# Patient Record
Sex: Female | Born: 1960 | Race: White | Hispanic: No | Marital: Married | Smoking: Former smoker
Health system: Southern US, Community
[De-identification: ages and names within clinical notes are randomized; demographics above are authoritative.]

## PROBLEM LIST (undated history)

## (undated) DIAGNOSIS — E079 Disorder of thyroid, unspecified: Secondary | ICD-10-CM

## (undated) DIAGNOSIS — I10 Essential (primary) hypertension: Secondary | ICD-10-CM

## (undated) HISTORY — PX: SHOULDER SURGERY: SHX246

---

## 1998-02-19 ENCOUNTER — Emergency Department (HOSPITAL_COMMUNITY): Admission: EM | Admit: 1998-02-19 | Discharge: 1998-02-19 | Payer: Self-pay | Admitting: Emergency Medicine

## 1998-10-10 ENCOUNTER — Encounter: Admission: RE | Admit: 1998-10-10 | Discharge: 1998-10-10 | Payer: Self-pay | Admitting: Internal Medicine

## 1998-11-09 ENCOUNTER — Encounter: Admission: RE | Admit: 1998-11-09 | Discharge: 1998-11-09 | Payer: Self-pay | Admitting: Internal Medicine

## 1999-10-26 ENCOUNTER — Encounter: Admission: RE | Admit: 1999-10-26 | Discharge: 1999-10-26 | Payer: Self-pay | Admitting: Internal Medicine

## 1999-11-18 ENCOUNTER — Emergency Department (HOSPITAL_COMMUNITY): Admission: EM | Admit: 1999-11-18 | Discharge: 1999-11-18 | Payer: Self-pay | Admitting: *Deleted

## 1999-11-18 ENCOUNTER — Encounter: Payer: Self-pay | Admitting: *Deleted

## 1999-12-25 ENCOUNTER — Encounter: Admission: RE | Admit: 1999-12-25 | Discharge: 1999-12-25 | Payer: Self-pay | Admitting: Internal Medicine

## 2000-02-15 ENCOUNTER — Ambulatory Visit (HOSPITAL_COMMUNITY): Admission: RE | Admit: 2000-02-15 | Discharge: 2000-02-15 | Payer: Self-pay | Admitting: Gastroenterology

## 2000-04-23 ENCOUNTER — Emergency Department (HOSPITAL_COMMUNITY): Admission: EM | Admit: 2000-04-23 | Discharge: 2000-04-23 | Payer: Self-pay | Admitting: *Deleted

## 2000-12-27 ENCOUNTER — Encounter: Payer: Self-pay | Admitting: Emergency Medicine

## 2000-12-27 ENCOUNTER — Emergency Department (HOSPITAL_COMMUNITY): Admission: EM | Admit: 2000-12-27 | Discharge: 2000-12-27 | Payer: Self-pay | Admitting: Emergency Medicine

## 2001-09-21 ENCOUNTER — Emergency Department (HOSPITAL_COMMUNITY): Admission: EM | Admit: 2001-09-21 | Discharge: 2001-09-21 | Payer: Self-pay | Admitting: Emergency Medicine

## 2001-10-31 ENCOUNTER — Encounter: Payer: Self-pay | Admitting: Emergency Medicine

## 2001-10-31 ENCOUNTER — Emergency Department (HOSPITAL_COMMUNITY): Admission: EM | Admit: 2001-10-31 | Discharge: 2001-10-31 | Payer: Self-pay | Admitting: Emergency Medicine

## 2002-01-15 ENCOUNTER — Emergency Department (HOSPITAL_COMMUNITY): Admission: EM | Admit: 2002-01-15 | Discharge: 2002-01-15 | Payer: Self-pay | Admitting: *Deleted

## 2002-02-24 ENCOUNTER — Emergency Department (HOSPITAL_COMMUNITY): Admission: EM | Admit: 2002-02-24 | Discharge: 2002-02-24 | Payer: Self-pay | Admitting: Emergency Medicine

## 2002-06-21 ENCOUNTER — Emergency Department (HOSPITAL_COMMUNITY): Admission: EM | Admit: 2002-06-21 | Discharge: 2002-06-21 | Payer: Self-pay

## 2002-07-20 ENCOUNTER — Emergency Department (HOSPITAL_COMMUNITY): Admission: EM | Admit: 2002-07-20 | Discharge: 2002-07-20 | Payer: Self-pay | Admitting: Emergency Medicine

## 2002-07-20 ENCOUNTER — Encounter: Payer: Self-pay | Admitting: Emergency Medicine

## 2002-12-18 ENCOUNTER — Emergency Department (HOSPITAL_COMMUNITY): Admission: EM | Admit: 2002-12-18 | Discharge: 2002-12-18 | Payer: Self-pay | Admitting: Emergency Medicine

## 2002-12-18 ENCOUNTER — Encounter: Payer: Self-pay | Admitting: Emergency Medicine

## 2003-01-03 ENCOUNTER — Encounter: Admission: RE | Admit: 2003-01-03 | Discharge: 2003-01-03 | Payer: Self-pay | Admitting: Internal Medicine

## 2003-02-24 ENCOUNTER — Ambulatory Visit (HOSPITAL_COMMUNITY): Admission: RE | Admit: 2003-02-24 | Discharge: 2003-02-24 | Payer: Self-pay | Admitting: Obstetrics & Gynecology

## 2003-04-19 ENCOUNTER — Emergency Department (HOSPITAL_COMMUNITY): Admission: EM | Admit: 2003-04-19 | Discharge: 2003-04-20 | Payer: Self-pay | Admitting: Emergency Medicine

## 2003-12-03 ENCOUNTER — Encounter: Admission: RE | Admit: 2003-12-03 | Discharge: 2004-02-01 | Payer: Self-pay | Admitting: Orthopedic Surgery

## 2004-01-04 ENCOUNTER — Encounter: Admission: RE | Admit: 2004-01-04 | Discharge: 2004-01-04 | Payer: Self-pay | Admitting: Orthopedic Surgery

## 2006-05-16 ENCOUNTER — Ambulatory Visit: Payer: Self-pay | Admitting: Internal Medicine

## 2006-05-17 ENCOUNTER — Ambulatory Visit: Payer: Self-pay | Admitting: *Deleted

## 2006-05-17 ENCOUNTER — Ambulatory Visit: Payer: Self-pay | Admitting: Internal Medicine

## 2006-05-17 ENCOUNTER — Ambulatory Visit (HOSPITAL_COMMUNITY): Admission: RE | Admit: 2006-05-17 | Discharge: 2006-05-17 | Payer: Self-pay | Admitting: Internal Medicine

## 2006-05-21 ENCOUNTER — Ambulatory Visit: Payer: Self-pay | Admitting: Internal Medicine

## 2006-06-14 ENCOUNTER — Ambulatory Visit: Payer: Self-pay | Admitting: Internal Medicine

## 2006-06-26 ENCOUNTER — Ambulatory Visit: Payer: Self-pay | Admitting: Family Medicine

## 2006-06-26 ENCOUNTER — Encounter: Payer: Self-pay | Admitting: Internal Medicine

## 2006-08-01 ENCOUNTER — Emergency Department (HOSPITAL_COMMUNITY): Admission: EM | Admit: 2006-08-01 | Discharge: 2006-08-01 | Payer: Self-pay | Admitting: Emergency Medicine

## 2007-05-05 ENCOUNTER — Emergency Department (HOSPITAL_COMMUNITY): Admission: EM | Admit: 2007-05-05 | Discharge: 2007-05-05 | Payer: Self-pay | Admitting: Family Medicine

## 2009-02-02 ENCOUNTER — Emergency Department (HOSPITAL_COMMUNITY): Admission: EM | Admit: 2009-02-02 | Discharge: 2009-02-02 | Payer: Self-pay | Admitting: Emergency Medicine

## 2009-07-24 ENCOUNTER — Emergency Department (HOSPITAL_COMMUNITY): Admission: EM | Admit: 2009-07-24 | Discharge: 2009-07-24 | Payer: Self-pay | Admitting: Emergency Medicine

## 2009-08-08 ENCOUNTER — Ambulatory Visit: Payer: Self-pay | Admitting: Vascular Surgery

## 2009-08-08 ENCOUNTER — Emergency Department (HOSPITAL_COMMUNITY): Admission: EM | Admit: 2009-08-08 | Discharge: 2009-08-08 | Payer: Self-pay | Admitting: Emergency Medicine

## 2009-08-09 ENCOUNTER — Ambulatory Visit (HOSPITAL_COMMUNITY): Admission: RE | Admit: 2009-08-09 | Discharge: 2009-08-09 | Payer: Self-pay | Admitting: Emergency Medicine

## 2009-08-09 ENCOUNTER — Encounter (INDEPENDENT_AMBULATORY_CARE_PROVIDER_SITE_OTHER): Payer: Self-pay | Admitting: Emergency Medicine

## 2010-03-25 ENCOUNTER — Encounter: Payer: Self-pay | Admitting: Internal Medicine

## 2010-07-21 NOTE — Procedures (Signed)
[facility]  Patient:    Felicia Faulkner, Felicia Faulkner                        MRN: [id number] Proc. Date: 02/15/00 Attending:  Ulyess Mort, M.D. [facility]                           Procedure Report  PROCEDURES: 1. Upper endoscopy. 2. Flexible sigmoidoscopy.  INDICATIONS:  This is a 50 year old female patient who comes to [facility].  She has had a number of years of heartburn.  She said she had an endoscopic examination four or five years ago and now has worsening pain, and pain regardless of meals.  She gets, what sounds like, dysphagia and reflux.  She has also had some trouble with rectal bleeding with noticing bright red blood with wiping; there is no blood in the bowl.  A question in the past of having H. pylori.  Impression at the time was for significant gastroesophageal reflux disease or dysphagia, probably secondary to stricture and rectal bleeding probably secondary to hemorrhoids.  It was felt thusly she should be on a proton pump inhibitor and was treated with Protonix one q.a.m. and Canasa suppositories as well as Analpram HC cream.  ANESTHESIA:  Premedicated with __ mg Versed, 62.5 mcg fentanyl IV.  The oropharynx was treated with Hurricaine spray.  PROCEDURE:  UPPER ENDOSCOPY  DESCRIPTION OF PROCEDURE:  The Olympus video endoscope was passed through the mouth into the proximal esophagus without difficulty, was normal.  The distal esophagus revealed a 1-2 cm sliding hiatal hernia with no evidence of a stricture.  The endoscope was passed to the stomach where there were changes of a mild to moderate diffuse gastritis.  CLOtest was thusly obtained. The pyloric channel was slightly inflamed.  The duodenal bulb and C-loop revealed granular mucosa consistent with a mild, chronic duodenitis. The endoscope was brought back into the stomach and inverted on itself.  The fundus and cardia were visualized and no evidence of abnormality was   seen. The endoscope was then turned back into normal position.  Biopsies were taken for CLOtest.  The endoscope was then retracted completely.  Photographs were obtained.  The patient tolerated the procedure nicely.  IMPRESSION: 1. Esophagus:  A small hernia with mild reflux esophagitis; no evidence    of a stricture. 2. Stomach:  Mild to moderate gastritis.  Biopsies obtained for    Helicobacter pylori. 3. Pyloric channel:  Slight inflammation as reported above.  Mild,    chronic duodenitis. Findings of C-loop the same.  PLAN:  I would keep her on a proton pump inhibitor and hope that this will help her, however, if her reflux persists then I think she needs further assessment to see if any more definitive therapy is needed to be done.  FLEXIBLE SIGMOIDOSCOPY  INDICATIONS:  Same as above.  ANESTHESIA:  Same as above.  DESCRIPTION OF PROCEDURE:  The Olympus pediatric colonoscope was advanced to the mid transverse colon and on retraction, careful inspection of a fairly well-prepped colon failed to reveal any mucosal lesions of significance.  We did retrovert the endoscope into the rectum and she did have some grade 1 internal hemorrhoids and some hemorrhoidal tags as well as some external hemorrhoids, approximately grade 1-2 at best.  There was no evidence of bleeding at the time.  There was no evidence of thrombosis.  The scope was retracted  completely.  A rectal examination reveals a small rectocele and otherwise was negative with normal sphincter tone.  PLAN:  I would treat her continuously with a bulk diet, Analpram HC cream as needed and I will see her back in the office for followup in the next several weeks. DD:  02/15/00 TD:  02/16/00 Job: [id number]

## 2011-07-30 ENCOUNTER — Emergency Department (HOSPITAL_COMMUNITY): Payer: Self-pay

## 2011-07-30 ENCOUNTER — Encounter (HOSPITAL_COMMUNITY): Payer: Self-pay | Admitting: *Deleted

## 2011-07-30 ENCOUNTER — Emergency Department (HOSPITAL_COMMUNITY)
Admission: EM | Admit: 2011-07-30 | Discharge: 2011-07-30 | Disposition: A | Discharge status: Home Health | Payer: Self-pay | Attending: Emergency Medicine | Admitting: Emergency Medicine

## 2011-07-30 DIAGNOSIS — R109 Unspecified abdominal pain: Secondary | ICD-10-CM | POA: Insufficient documentation

## 2011-07-30 DIAGNOSIS — E079 Disorder of thyroid, unspecified: Secondary | ICD-10-CM | POA: Insufficient documentation

## 2011-07-30 HISTORY — DX: Disorder of thyroid, unspecified: E07.9

## 2011-07-30 LAB — COMPREHENSIVE METABOLIC PANEL
ALT: 13 U/L (ref 0–35)
AST: 17 U/L (ref 0–37)
Albumin: 3.6 g/dL (ref 3.5–5.2)
Chloride: 105 mEq/L (ref 96–112)
Creatinine, Ser: 0.8 mg/dL (ref 0.50–1.10)
Sodium: 141 mEq/L (ref 135–145)
Total Bilirubin: 0.2 mg/dL — ABNORMAL LOW (ref 0.3–1.2)

## 2011-07-30 LAB — DIFFERENTIAL
Basophils Absolute: 0 10*3/uL (ref 0.0–0.1)
Basophils Relative: 0 % (ref 0–1)
Eosinophils Absolute: 0 10*3/uL (ref 0.0–0.7)
Eosinophils Relative: 0 % (ref 0–5)
Lymphocytes Relative: 33 % (ref 12–46)
Lymphs Abs: 3.1 10*3/uL (ref 0.7–4.0)
Monocytes Absolute: 0.5 10*3/uL (ref 0.1–1.0)
Monocytes Relative: 5 % (ref 3–12)
Neutro Abs: 5.6 10*3/uL (ref 1.7–7.7)
Neutrophils Relative %: 61 % (ref 43–77)

## 2011-07-30 LAB — CBC
HCT: 38.3 % (ref 36.0–46.0)
Hemoglobin: 12.8 g/dL (ref 12.0–15.0)
MCH: 30.1 pg (ref 26.0–34.0)
MCHC: 33.4 g/dL (ref 30.0–36.0)
MCV: 90.1 fL (ref 78.0–100.0)
Platelets: 128 10*3/uL — ABNORMAL LOW (ref 150–400)
RBC: 4.25 MIL/uL (ref 3.87–5.11)
RDW: 12.9 % (ref 11.5–15.5)
WBC: 9.2 10*3/uL (ref 4.0–10.5)

## 2011-07-30 LAB — URINALYSIS, ROUTINE W REFLEX MICROSCOPIC
Bilirubin Urine: NEGATIVE
Glucose, UA: NEGATIVE mg/dL
Hgb urine dipstick: NEGATIVE
Ketones, ur: NEGATIVE mg/dL
Nitrite: NEGATIVE
Protein, ur: NEGATIVE mg/dL
Specific Gravity, Urine: 1.028 (ref 1.005–1.030)
Urobilinogen, UA: 0.2 mg/dL (ref 0.0–1.0)
pH: 5 (ref 5.0–8.0)

## 2011-07-30 LAB — GLUCOSE, CAPILLARY: Glucose-Capillary: 155 mg/dL — ABNORMAL HIGH (ref 70–99)

## 2011-07-30 LAB — URINE MICROSCOPIC-ADD ON

## 2011-07-30 MED ORDER — GI COCKTAIL ~~LOC~~
30.0000 mL | Freq: Once | ORAL | Status: AC
  Administered 2011-07-30: 30 mL via ORAL
  Filled 2011-07-30: qty 30

## 2011-07-30 MED ORDER — OMEPRAZOLE 20 MG PO CPDR: 20.0000 mg | DELAYED_RELEASE_CAPSULE | Freq: Every day | ORAL | Status: DC

## 2011-07-30 MED ORDER — IOHEXOL 300 MG/ML  SOLN
100.0000 mL | Freq: Once | INTRAMUSCULAR | Status: AC | PRN
  Administered 2011-07-30: 100 mL via INTRAVENOUS

## 2011-07-30 NOTE — Discharge Instructions (Signed)
Abdominal Pain (Nonspecific) Your exam might not show the exact reason you have abdominal pain. Since there are many different causes of abdominal pain, another checkup and more tests may be needed. It is very important to follow up for lasting (persistent) or worsening symptoms. A possible cause of abdominal pain in any person who still has his or her appendix is acute appendicitis. Appendicitis is often hard to diagnose. Normal blood tests, urine tests, ultrasound, and CT scans do not completely rule out early appendicitis or other causes of abdominal pain. Sometimes, only the changes that happen over time will allow appendicitis and other causes of abdominal pain to be determined. Other potential problems that may require surgery may also take time to become more apparent. Because of this, it is important that you follow all of the instructions below. HOME CARE INSTRUCTIONS   Rest as much as possible.   Do not eat solid food until your pain is gone.   While adults or children have pain: A diet of water, weak decaffeinated tea, broth or bouillon, gelatin, oral rehydration solutions (ORS), frozen ice pops, or ice chips may be helpful.   When pain is gone in adults or children: Start a light diet (dry toast, crackers, applesauce, or white rice). Increase the diet slowly as long as it does not bother you. Eat no dairy products (including cheese and eggs) and no spicy, fatty, fried, or high-fiber foods.   Use no alcohol, caffeine, or cigarettes.   Take your regular medicines unless your caregiver told you not to.   Take any prescribed medicine as directed.   Only take over-the-counter or prescription medicines for pain, discomfort, or fever as directed by your caregiver. Do not give aspirin to children.  If your caregiver has given you a follow-up appointment, it is very important to keep that appointment. Not keeping the appointment could result in a permanent injury and/or lasting (chronic) pain  and/or disability. If there is any problem keeping the appointment, you must call to reschedule.  SEEK IMMEDIATE MEDICAL CARE IF:   Your pain is not gone in 24 hours.   Your pain becomes worse, changes location, or feels different.   You or your child has an oral temperature above 102 F (38.9 C), not controlled by medicine.   Your baby is older than 3 months with a rectal temperature of 102 F (38.9 C) or higher.   Your baby is 3 months old or younger with a rectal temperature of 100.4 F (38 C) or higher.   You have shaking chills.   You keep throwing up (vomiting) or cannot drink liquids.   There is blood in your vomit or you see blood in your bowel movements.   Your bowel movements become dark or black.   You have frequent bowel movements.   Your bowel movements stop (become blocked) or you cannot pass gas.   You have bloody, frequent, or painful urination.   You have yellow discoloration in the skin or whites of the eyes.   Your stomach becomes bloated or bigger.   You have dizziness or fainting.   You have chest or back pain.  MAKE SURE YOU:   Understand these instructions.   Will watch your condition.   Will get help right away if you are not doing well or get worse.  Document Released: 02/19/2005 Document Revised: 02/08/2011 Document Reviewed: 01/17/2009 ExitCare Patient Information 2012 [employer].   RESOURCE GUIDE  Dental Problems  Patients with Medicaid: [facility]                     [address]                                           Phone:  [phone]                                                  If unable to pay or uninsured, contact:  [facility] or [facility] to become qualified for the adult dental clinic.  Chronic Pain Problems Contact [facility]  [phone] Patients need to be referred by their primary care doctor.  Insufficient Money for Medicine Contact [employer]:  call "211" or [facility] 271-5999.  No Primary Care Doctor Call [facility]  832-8000 Other agencies that provide inexpensive medical care    [facility]  [phone]    [facility]  [phone]    [facility]  [phone]    [facility] Clinic  [phone]    [facility]  [phone]    [facility]  [phone]  Substance Abuse Resources [facility]  336-882-2125 [facility] 336-784-9470 [facility] 336-285-9073 [facility] 336-845-3988 Residential & Outpatient Substance Abuse Program  800-659-3381  Psychological Services [facility]  832-9600 [facility]  378-7881 [facility]   800 853-5163 (emergency services 641-4993)  Abuse/Neglect [place] Child Abuse Hotline (336) 641-3795 [place] Child Abuse Hotline 800-378-5315 (After Hours)  Emergency Shelter  [facility] (336) 271-5985  [facility] (336) 275-9566 [facility] (704) 372-4663  MRSA Hotline #:   832-7006    [place] Resources  [facility] [place]  [employer]                           [facility] [address] [place]                     [address]         [address]  [place]                                               [place]                              [place] Phone:  [phone]                                  Phone:  [phone]                   Phone:  [phone]  [facility] Phone:  [phone]  [place] Child Abuse Hotline (336) 342-1394 (336) 342-3537 (After Hours) 

## 2011-07-30 NOTE — ED Provider Notes (Signed)
History     CSN: [id number]  Arrival date & time 07/30/11  1353   First MD Initiated Contact with Patient 07/30/11 1541      Chief Complaint  Patient presents with  . Flank Pain    (Consider location/radiation/quality/duration/timing/severity/associated sxs/prior treatment) HPI Comments: Has not been able to obtain a PCP to workup abd pain.  Worsened over 2 years in LUQ/L flank.  Good PO intake and otherwise doing well.   Patient is a 51 y.o. female presenting with flank pain. The history is provided by the patient.  Flank Pain This is a recurrent problem. Episode onset: 2 years. The problem occurs intermittently. The problem has been gradually worsening. Associated symptoms include abdominal pain (L sided). Pertinent negatives include no chest pain, congestion, fever, nausea, rash or vomiting. Exacerbated by: moving torso. She has tried nothing for the symptoms.    Past Medical History  Diagnosis Date  . Thyroid disease     Past Surgical History  Procedure Date  . Shoulder surgery     History reviewed. No pertinent family history.  History  Substance Use Topics  . Smoking status: Never Smoker   . Smokeless tobacco: Not on file  . Alcohol Use: No    OB History    Grav Para Term Preterm Abortions TAB SAB Ect Mult Living                  Review of Systems  Constitutional: Negative for fever and activity change.  HENT: Negative for congestion.   Eyes: Negative for visual disturbance.  Respiratory: Negative for chest tightness and shortness of breath.   Cardiovascular: Negative for chest pain and leg swelling.  Gastrointestinal: Positive for abdominal pain (L sided). Negative for nausea and vomiting.  Genitourinary: Positive for flank pain. Negative for dysuria.  Skin: Negative for rash.  Neurological: Negative for syncope.  Psychiatric/Behavioral: Negative for behavioral problems.    Allergies  Review of patient's allergies indicates no known  allergies.  Home Medications   Current Outpatient Rx  Name Route Sig Dispense Refill  . ADULT MULTIVITAMIN W/MINERALS CH Oral Take 1 tablet by mouth daily.      BP 123/68  Pulse 77  Temp(Src) 98.3 F (36.8 C) (Oral)  Resp 20  SpO2 97%  Physical Exam  Constitutional: She is oriented to person, place, and time. She appears well-developed and well-nourished.  HENT:  Head: Normocephalic and atraumatic.  Eyes: Conjunctivae and EOM are normal. Pupils are equal, round, and reactive to light. No scleral icterus.  Neck: Normal range of motion. Neck supple.  Cardiovascular: Normal rate and regular rhythm.  Exam reveals no gallop and no friction rub.   No murmur heard. Pulmonary/Chest: Effort normal and breath sounds normal. No respiratory distress. She has no wheezes. She has no rales. She exhibits no tenderness.  Abdominal: Soft. She exhibits no distension and no mass. There is tenderness (moderate LUQ). There is no rebound and no guarding.       Negative murphys.  Musculoskeletal: Normal range of motion.  Neurological: She is alert and oriented to person, place, and time. She has normal reflexes. No cranial nerve deficit.  Skin: Skin is warm and dry. No rash noted.  Psychiatric: She has a normal mood and affect. Her behavior is normal. Judgment and thought content normal.    ED Course  Procedures (including critical care time)  Labs Reviewed  GLUCOSE, CAPILLARY - Abnormal; Notable for the following:    Glucose-Capillary 155 (*)  All other components within normal limits  URINALYSIS, ROUTINE W REFLEX MICROSCOPIC - Abnormal; Notable for the following:    Leukocytes, UA TRACE (*)    All other components within normal limits  URINE MICROSCOPIC-ADD ON - Abnormal; Notable for the following:    Squamous Epithelial / LPF FEW (*)    All other components within normal limits  CBC - Abnormal; Notable for the following:    Platelets 128 (*)    All other components within normal limits   DIFFERENTIAL  COMPREHENSIVE METABOLIC PANEL  LIPASE, BLOOD   No results found.   1. Left sided abdominal pain       MDM  Has not been able to obtain a PCP to workup abd pain.  Worsened over 2 years in LUQ/L flank.  Good PO intake and otherwise doing well.  VSS and well appearing.  TTP in LUQ.  VSS and otherwise well appearing.  Will CT abdomen given persistent pain and tenderness on exam.  Treating with GI cocktail.  Pt declines other meds.    5:56 PM Feeling better after Gi cocktail.  Labs, imaging unconcerning.  Gave info to establish PCP.  Will do trial of PPI.  Pt comfortable with plan and will follow up.     Army Chaco, MD 07/30/11 1757

## 2011-07-30 NOTE — ED Notes (Signed)
Pt undressing and putting on a gown at this time 

## 2011-07-30 NOTE — ED Notes (Signed)
C/o left upper quad pain onset several years ago however states the pain is picking worse. States she gets nauseated when the pain is bad.

## 2011-07-30 NOTE — ED Notes (Signed)
Pt returned from being out of the department (CT scans)

## 2011-07-30 NOTE — ED Notes (Signed)
CBG 155 Rn notified [employer]

## 2011-07-31 NOTE — ED Provider Notes (Signed)
I saw and evaluated the patient, reviewed the resident's note and I agree with the findings and plan.  I saw the patient along with Dr. Maisie Fus and agree with his note, assessment, and plan.  The patient presents with the complaints of llq and left flank pain for the past several months and is getting worse.  She is from [place] and does not have a pcp.  There is no n/v/d or urinary complaints.  No fevers.  On exam, the patient is afebrile and the vitals are stable.  She is mildly ttp in the llq without rebound or guarding.  Bowel sounds are normoactive.  A ct scan was obtained and is essentially unremarkable.  At this point, it does not appear as though there is an emergent cause for her symptoms.  She will be discharged to home, to return prn if worsens.    Geoffery Lyons, MD 07/31/11 1209

## 2011-09-25 ENCOUNTER — Other Ambulatory Visit: Payer: Self-pay | Admitting: Nurse Practitioner

## 2012-05-28 ENCOUNTER — Other Ambulatory Visit: Payer: Self-pay | Admitting: Emergency Medicine

## 2012-05-28 DIAGNOSIS — Z1231 Encounter for screening mammogram for malignant neoplasm of breast: Secondary | ICD-10-CM

## 2012-05-30 ENCOUNTER — Ambulatory Visit
Admission: RE | Admit: 2012-05-30 | Discharge: 2012-05-30 | Disposition: A | Discharge status: Home / Self Care | Payer: BC Managed Care – PPO | Source: Ambulatory Visit | Attending: Emergency Medicine | Admitting: Emergency Medicine

## 2012-05-30 DIAGNOSIS — Z1231 Encounter for screening mammogram for malignant neoplasm of breast: Secondary | ICD-10-CM

## 2012-06-22 ENCOUNTER — Other Ambulatory Visit: Payer: Self-pay | Admitting: Nurse Practitioner

## 2012-06-23 ENCOUNTER — Encounter: Payer: Self-pay | Admitting: Nurse Practitioner

## 2012-12-09 ENCOUNTER — Other Ambulatory Visit: Payer: Self-pay | Admitting: Family Medicine

## 2012-12-09 DIAGNOSIS — E041 Nontoxic single thyroid nodule: Secondary | ICD-10-CM

## 2012-12-16 ENCOUNTER — Ambulatory Visit
Admission: RE | Admit: 2012-12-16 | Discharge: 2012-12-16 | Disposition: A | Discharge status: Home / Self Care | Payer: BC Managed Care – PPO | Source: Ambulatory Visit | Attending: Family Medicine | Admitting: Family Medicine

## 2012-12-16 DIAGNOSIS — E041 Nontoxic single thyroid nodule: Secondary | ICD-10-CM

## 2012-12-22 ENCOUNTER — Other Ambulatory Visit: Payer: Self-pay | Admitting: Family Medicine

## 2012-12-22 DIAGNOSIS — E041 Nontoxic single thyroid nodule: Secondary | ICD-10-CM

## 2012-12-24 ENCOUNTER — Ambulatory Visit
Admission: RE | Admit: 2012-12-24 | Discharge: 2012-12-24 | Disposition: A | Discharge status: Home / Self Care | Payer: BC Managed Care – PPO | Source: Ambulatory Visit | Attending: Family Medicine | Admitting: Family Medicine

## 2012-12-24 ENCOUNTER — Other Ambulatory Visit (HOSPITAL_COMMUNITY)
Admission: RE | Admit: 2012-12-24 | Discharge: 2012-12-24 | Disposition: A | Discharge status: Home / Self Care | Payer: BC Managed Care – PPO | Source: Ambulatory Visit | Attending: Interventional Radiology | Admitting: Interventional Radiology

## 2012-12-24 DIAGNOSIS — E041 Nontoxic single thyroid nodule: Secondary | ICD-10-CM | POA: Insufficient documentation

## 2013-01-27 ENCOUNTER — Ambulatory Visit: Payer: BC Managed Care – PPO | Admitting: Interventional Cardiology

## 2014-06-10 ENCOUNTER — Telehealth: Payer: Self-pay | Admitting: Internal Medicine

## 2014-06-10 NOTE — Telephone Encounter (Signed)
[facility] on 07/15/14 with Dr Rennis GoldenHilty.  [facility] for Dr Blanchie DessertHilty's schedule on 07/15/14. lp

## 2014-07-15 ENCOUNTER — Encounter: Payer: Self-pay | Admitting: Internal Medicine

## 2014-07-15 ENCOUNTER — Ambulatory Visit (INDEPENDENT_AMBULATORY_CARE_PROVIDER_SITE_OTHER): Payer: 59 | Admitting: Internal Medicine

## 2014-07-15 VITALS — BP 120/88 | HR 71 | Ht 67.0 in | Wt 187.6 lb

## 2014-07-15 DIAGNOSIS — Z8249 Family history of ischemic heart disease and other diseases of the circulatory system: Secondary | ICD-10-CM

## 2014-07-15 DIAGNOSIS — Z13220 Encounter for screening for lipoid disorders: Secondary | ICD-10-CM | POA: Insufficient documentation

## 2014-07-15 DIAGNOSIS — R0789 Other chest pain: Secondary | ICD-10-CM | POA: Diagnosis not present

## 2014-07-15 LAB — LIPID PANEL
Cholesterol: 274 mg/dL — ABNORMAL HIGH (ref 0–200)
HDL: 86 mg/dL (ref >=46)
LDL Cholesterol: 168 mg/dL — ABNORMAL HIGH (ref 0–99)
TRIGLYCERIDES: 99 mg/dL (ref <150)
Total CHOL/HDL Ratio: 3.2 Ratio
VLDL: 20 mg/dL (ref 0–40)

## 2014-07-15 NOTE — Progress Notes (Signed)
OFFICE NOTE  Chief Complaint:  Sharp chest pain, occasional palpitations  Primary Care Physician: Lupe Carneyean Mitchell, MD  HPI:  Vance GatherSanije Smylie is a pleasant 54 year old [place] female who is accompanied today by her daughter and who is also translating. Past medical history significant for hypertension. She's noted to have blood pressures up to the 180s when not taking her medicine however on medication is well-controlled. When her blood pressure size she does not note any significant chest pain or associated symptoms. She really has been having some sharp, quick chest discomfort. Is not necessarily associated with exertion or relieved by rest. She denies any worsening shortness of breath. She occasionally gets a fast heartbeat, but it sounds like mostly when she is anxious. There is concern the family due to history of heart disease throughout the family and they're interested in screening for coronary artery disease. I reviewed recent laboratory work and do not see a lipid profile.  PMHx:  Past Medical History  Diagnosis Date  . Thyroid disease     Past Surgical History  Procedure Laterality Date  . Shoulder surgery      FAMHx:  Family History  Problem Relation Age of Onset  . Heart attack Father   . Heart attack Brother     SOCHx:   reports that she has been smoking Cigarettes.  She has been smoking about 0.10 packs per day. She does not have any smokeless tobacco history on file. She reports that she does not drink alcohol or use illicit drugs.  ALLERGIES:  No Known Allergies  ROS: A comprehensive review of systems was negative except for: Cardiovascular: positive for chest pain and palpitations  HOME MEDS: Current Outpatient Prescriptions  Medication Sig Dispense Refill  . fosinopril-hydrochlorothiazide (MONOPRIL-HCT) 10-12.5 MG per tablet Take 1 tablet by mouth daily.  3   No current facility-administered medications for this visit.    LABS/IMAGING: No results  found for this or any previous visit (from the past 48 hour(s)). No results found.  WEIGHTS: Wt Readings from Last 3 Encounters:  07/15/14 187 lb 9.6 oz (85.095 kg)    VITALS: BP 120/88 mmHg  Pulse 71  Ht 5\' 7"  (1.702 m)  Wt 187 lb 9.6 oz (85.095 kg)  BMI 29.38 kg/m2  EXAM: General appearance: alert and no distress Neck: no carotid bruit and no JVD Lungs: clear to auscultation bilaterally Heart: regular rate and rhythm, S1, S2 normal, no murmur, click, rub or gallop Abdomen: soft, non-tender; bowel sounds normal; no masses,  no organomegaly Extremities: extremities normal, atraumatic, no cyanosis or edema Pulses: 2+ and symmetric Skin: Skin color, texture, turgor normal. No rashes or lesions Neurologic: Grossly normal Psych: Mildly anxious  EKG: Normal sinus rhythm at 71  ASSESSMENT: 1. Atypical, sharp chest pain 2. Mild anxiety 3. Strong family history of coronary disease 4. Hypertension-controlled  PLAN: 1.   Mrs. Juanetta BeetsJakupi has a strong family history of coronary disease in her father died at a young age of heart attack in her brother who had also died of heart attack in his 5240s. She's been describing some chest discomfort which is sharp, atypical and short in duration. Occasionally there are palpitations. This seems to be associated with anxiety. I don't believe she is having true angina, however she does desire risk factor identification. Her risk is increased to at least an intermediate level due to her family history. Given her age and thinks she's a good candidate for coronary artery calcium scoring. If she has significant coronary  artery calcium, and may be a role for stress testing. I also recommend a lipid profile. Plan to see her back to discuss those results in a few weeks.  Thanks for the kind referral.  Chrystie NoseKenneth C. Whitley Strycharz, MD, [facility] Attending Cardiologist [facility]  Chrystie NoseKenneth C Demario Faniel 07/15/2014, 9:21 AM

## 2014-07-15 NOTE — Patient Instructions (Signed)
Your physician recommends that you return for lab work FASTING - nothing to eat/drink after midnight - to check cholesterol   Non-Cardiac CT scanning, (CAT scanning), is a noninvasive, special x-ray that produces cross-sectional images of the body using x-rays and a computer. CT scans help physicians diagnose and treat medical conditions. For some CT exams, a contrast material is used to enhance visibility in the area of the body being studied. CT scans provide greater clarity and reveal more details than regular x-ray exams. >> Dr. Rennis GoldenHilty has order a CT coronary calcium score  >> [facility] [address]  Your physician recommends that you schedule a follow-up appointment after your tests with Dr. Rennis GoldenHilty.

## 2014-07-22 ENCOUNTER — Ambulatory Visit (INDEPENDENT_AMBULATORY_CARE_PROVIDER_SITE_OTHER)
Admission: RE | Admit: 2014-07-22 | Discharge: 2014-07-22 | Disposition: A | Discharge status: Home / Self Care | Payer: 59 | Source: Ambulatory Visit | Attending: Internal Medicine | Admitting: Internal Medicine

## 2014-07-22 DIAGNOSIS — Z8249 Family history of ischemic heart disease and other diseases of the circulatory system: Secondary | ICD-10-CM

## 2014-08-11 ENCOUNTER — Encounter: Payer: Self-pay | Admitting: Internal Medicine

## 2014-08-11 ENCOUNTER — Ambulatory Visit (INDEPENDENT_AMBULATORY_CARE_PROVIDER_SITE_OTHER): Payer: 59 | Admitting: Internal Medicine

## 2014-08-11 VITALS — BP 124/72 | HR 68 | Ht 67.0 in | Wt 186.3 lb

## 2014-08-11 DIAGNOSIS — Z8249 Family history of ischemic heart disease and other diseases of the circulatory system: Secondary | ICD-10-CM | POA: Diagnosis not present

## 2014-08-11 DIAGNOSIS — Z13220 Encounter for screening for lipoid disorders: Secondary | ICD-10-CM | POA: Diagnosis not present

## 2014-08-11 DIAGNOSIS — R0789 Other chest pain: Secondary | ICD-10-CM

## 2014-08-11 NOTE — Patient Instructions (Signed)
Your physician recommends that you schedule a follow-up appointment as needed.    Fat and Cholesterol Control Diet Fat and cholesterol levels in your blood and organs are influenced by your diet. High levels of fat and cholesterol may lead to diseases of the heart, small and large blood vessels, gallbladder, liver, and pancreas. CONTROLLING FAT AND CHOLESTEROL WITH DIET Although exercise and lifestyle factors are important, your diet is key. That is because certain foods are known to raise cholesterol and others to lower it. The goal is to balance foods for their effect on cholesterol and more importantly, to replace saturated and trans fat with other types of fat, such as monounsaturated fat, polyunsaturated fat, and omega-3 fatty acids. On average, a person should consume no more than 15 to 17 g of saturated fat daily. Saturated and trans fats are considered "bad" fats, and they will raise LDL cholesterol. Saturated fats are primarily found in animal products such as meats, butter, and cream. However, that does not mean you need to give up all your favorite foods. Today, there are good tasting, low-fat, low-cholesterol substitutes for most of the things you like to eat. Choose low-fat or nonfat alternatives. Choose round or loin cuts of red meat. These types of cuts are lowest in fat and cholesterol. Chicken (without the skin), fish, veal, and ground [place] breast are great choices. Eliminate fatty meats, such as hot dogs and salami. Even shellfish have little or no saturated fat. Have a 3 oz (85 g) portion when you eat lean meat, poultry, or fish. Trans fats are also called "partially hydrogenated oils." They are oils that have been scientifically manipulated so that they are solid at room temperature resulting in a longer shelf life and improved taste and texture of foods in which they are added. Trans fats are found in stick margarine, some tub margarines, cookies, crackers, and baked goods.  When  baking and cooking, oils are a great substitute for butter. The monounsaturated oils are especially beneficial since it is believed they lower LDL and raise HDL. The oils you should avoid entirely are saturated tropical oils, such as coconut and palm.  Remember to eat a lot from food groups that are naturally free of saturated and trans fat, including fish, fruit, vegetables, beans, grains (barley, rice, couscous, bulgur wheat), and pasta (without cream sauces).  IDENTIFYING FOODS THAT LOWER FAT AND CHOLESTEROL  Soluble fiber may lower your cholesterol. This type of fiber is found in fruits such as apples, vegetables such as broccoli, potatoes, and carrots, legumes such as beans, peas, and lentils, and grains such as barley. Foods fortified with plant sterols (phytosterol) may also lower cholesterol. You should eat at least 2 g per day of these foods for a cholesterol lowering effect.  Read package labels to identify low-saturated fats, trans fat free, and low-fat foods at the supermarket. Select cheeses that have only 2 to 3 g saturated fat per ounce. Use a heart-healthy tub margarine that is free of trans fats or partially hydrogenated oil. When buying baked goods (cookies, crackers), avoid partially hydrogenated oils. Breads and muffins should be made from whole grains (whole-wheat or whole oat flour, instead of "flour" or "enriched flour"). Buy non-creamy canned soups with reduced salt and no added fats.  FOOD PREPARATION TECHNIQUES  Never deep-fry. If you must fry, either stir-fry, which uses very little fat, or use non-stick cooking sprays. When possible, broil, bake, or roast meats, and steam vegetables. Instead of putting butter or margarine on vegetables,  use lemon and herbs, applesauce, and cinnamon (for squash and sweet potatoes). Use nonfat yogurt, salsa, and low-fat dressings for salads.  LOW-SATURATED FAT / LOW-FAT FOOD SUBSTITUTES Meats / Saturated Fat (g)  Avoid: Steak, marbled (3 oz/85 g)  / 11 g  Choose: Steak, lean (3 oz/85 g) / 4 g  Avoid: Hamburger (3 oz/85 g) / 7 g  Choose: Hamburger, lean (3 oz/85 g) / 5 g  Avoid: Ham (3 oz/85 g) / 6 g  Choose: Ham, lean cut (3 oz/85 g) / 2.4 g  Avoid: Chicken, with skin, dark meat (3 oz/85 g) / 4 g  Choose: Chicken, skin removed, dark meat (3 oz/85 g) / 2 g  Avoid: Chicken, with skin, light meat (3 oz/85 g) / 2.5 g  Choose: Chicken, skin removed, light meat (3 oz/85 g) / 1 g Dairy / Saturated Fat (g)  Avoid: Whole milk (1 cup) / 5 g  Choose: Low-fat milk, 2% (1 cup) / 3 g  Choose: Low-fat milk, 1% (1 cup) / 1.5 g  Choose: Skim milk (1 cup) / 0.3 g  Avoid: Hard cheese (1 oz/28 g) / 6 g  Choose: Skim milk cheese (1 oz/28 g) / 2 to 3 g  Avoid: Cottage cheese, 4% fat (1 cup) / 6.5 g  Choose: Low-fat cottage cheese, 1% fat (1 cup) / 1.5 g  Avoid: Ice cream (1 cup) / 9 g  Choose: Sherbet (1 cup) / 2.5 g  Choose: Nonfat frozen yogurt (1 cup) / 0.3 g  Choose: Frozen fruit bar / trace  Avoid: Whipped cream (1 tbs) / 3.5 g  Choose: Nondairy whipped topping (1 tbs) / 1 g Condiments / Saturated Fat (g)  Avoid: Mayonnaise (1 tbs) / 2 g  Choose: Low-fat mayonnaise (1 tbs) / 1 g  Avoid: Butter (1 tbs) / 7 g  Choose: Extra light margarine (1 tbs) / 1 g  Avoid: Coconut oil (1 tbs) / 11.8 g  Choose: Olive oil (1 tbs) / 1.8 g  Choose: Corn oil (1 tbs) / 1.7 g  Choose: Safflower oil (1 tbs) / 1.2 g  Choose: Sunflower oil (1 tbs) / 1.4 g  Choose: Soybean oil (1 tbs) / 2.4 g  Choose: Canola oil (1 tbs) / 1 g Document Released: 02/19/2005 Document Revised: 06/16/2012 Document Reviewed: 05/20/2013 ExitCare Patient Information 2015 [employer] [place]. This information is not intended to replace advice given to you by your health care provider. Make sure you discuss any questions you have with your health care provider.

## 2014-08-11 NOTE — Progress Notes (Signed)
OFFICE NOTE  Chief Complaint:  No complaints  Primary Care Physician: Felicia Carney, MD  HPI:  Felicia Faulkner is a pleasant 54 year old [place] female who is accompanied today by her daughter and who is also translating. Past medical history significant for hypertension. She's noted to have blood pressures up to the 180s when not taking her medicine however on medication is well-controlled. When her blood pressure size she does not note any significant chest pain or associated symptoms. She really has been having some sharp, quick chest discomfort. Is not necessarily associated with exertion or relieved by rest. She denies any worsening shortness of breath. She occasionally gets a fast heartbeat, but it sounds like mostly when she is anxious. There is concern the family due to history of heart disease throughout the family and they're interested in screening for coronary artery disease. I reviewed recent laboratory work and do not see a lipid profile.  I saw him Felicia Faulkner back today in follow-up. She reports her chest discomfort has improved. Her CT scan was negative for coronary calcium. No extracardiac findings were noted. This is associated with a low cardiovascular risk. Her lipid profile however does demonstrate an elevated total cholesterol and LDL cholesterol.  PMHx:  Past Medical History  Diagnosis Date  . Thyroid disease     Past Surgical History  Procedure Laterality Date  . Shoulder surgery      FAMHx:  Family History  Problem Relation Age of Onset  . Heart attack Father   . Heart attack Brother     SOCHx:   reports that she has been smoking Cigarettes.  She has been smoking about 0.10 packs per day. She does not have any smokeless tobacco history on file. She reports that she does not drink alcohol or use illicit drugs.  ALLERGIES:  No Known Allergies  ROS: A comprehensive review of systems was negative.  HOME MEDS: Current Outpatient Prescriptions    Medication Sig Dispense Refill  . etodolac (LODINE) 400 MG tablet Take 1 tablet by mouth daily. Take 1 tab twice daily as needed for pain    . fosinopril-hydrochlorothiazide (MONOPRIL-HCT) 10-12.5 MG per tablet Take 1 tablet by mouth daily.  3   No current facility-administered medications for this visit.    LABS/IMAGING: No results found for this or any previous visit (from the past 48 hour(s)). No results found.  WEIGHTS: Wt Readings from Last 3 Encounters:  08/11/14 186 lb 4.8 oz (84.505 kg)  07/15/14 187 lb 9.6 oz (85.095 kg)    VITALS: BP 124/72 mmHg  Pulse 68  Ht  (1.702 m)  Wt 186 lb 4.8 oz (84.505 kg)  BMI 29.17 kg/m2  EXAM: Deferred  EKG: Deferred  ASSESSMENT: 1. Atypical, sharp chest pain 2. Mild anxiety 3. Strong family history of coronary disease 4. Hypertension-controlled  PLAN: 1.   Felicia Faulkner has no evidence of coronary calcium which is reassuring. She is at intermediate risk for coronary disease given age and family history. Her cholesterol is quite elevated. I've given her literature on a heart healthy diet and hopefully she can work on lowering her cholesterol over the next 6 months. She is currently fasting for Ramadan which may help her with her cholesterol in some weight loss. She should have a repeat lipid profile in 6 months through her primary care provider and further follow-up can be with Dr. Clovis Faulkner.  I'm happy to see her back on an as-needed basis. One could consider repeat coronary calcium score  in 10 years.  Felicia NoseKenneth C. Hilty, MD, [facility] Attending Cardiologist [facility]  Felicia AbuKenneth C Faulkner 08/11/2014, 10:02 AM

## 2014-11-11 ENCOUNTER — Other Ambulatory Visit: Payer: Self-pay | Admitting: Family Medicine

## 2014-11-11 ENCOUNTER — Ambulatory Visit
Admission: RE | Admit: 2014-11-11 | Discharge: 2014-11-11 | Disposition: A | Discharge status: Home / Self Care | Payer: 59 | Source: Ambulatory Visit | Attending: Family Medicine | Admitting: Family Medicine

## 2014-11-11 ENCOUNTER — Other Ambulatory Visit (HOSPITAL_COMMUNITY)
Admission: RE | Admit: 2014-11-11 | Discharge: 2014-11-11 | Disposition: A | Discharge status: Home / Self Care | Payer: 59 | Source: Ambulatory Visit | Attending: Family Medicine | Admitting: Family Medicine

## 2014-11-11 DIAGNOSIS — Z124 Encounter for screening for malignant neoplasm of cervix: Secondary | ICD-10-CM | POA: Diagnosis present

## 2014-11-11 DIAGNOSIS — M79641 Pain in right hand: Secondary | ICD-10-CM

## 2014-11-15 ENCOUNTER — Other Ambulatory Visit: Payer: Self-pay | Admitting: *Deleted

## 2014-11-15 DIAGNOSIS — R200 Anesthesia of skin: Secondary | ICD-10-CM

## 2014-11-16 ENCOUNTER — Ambulatory Visit (INDEPENDENT_AMBULATORY_CARE_PROVIDER_SITE_OTHER): Payer: 59 | Admitting: Neurology

## 2014-11-16 DIAGNOSIS — R200 Anesthesia of skin: Secondary | ICD-10-CM | POA: Diagnosis not present

## 2014-11-16 DIAGNOSIS — G5601 Carpal tunnel syndrome, right upper limb: Secondary | ICD-10-CM

## 2014-11-16 LAB — CYTOLOGY - PAP

## 2014-11-16 NOTE — Procedures (Signed)
[facility]  [address] [place], [address]  [place], [place] [address] Tel: [phone] Fax:  [phone] Test Date:  11/16/2014  Patient: Felicia Faulkner DOB: [birth year] Physician: Nita Sickle  Sex: Female Height: 5\' 6"  Ref Phys: Lupe Carney, M.D.  ID#: [id number] Temp: 38.1C Technician: Judie Petit. Dean   Patient Complaints: This is a 54 year old female presenting for evaluation of bilateral hand paresthesias and pain.  NCV & EMG Findings: Extensive electrodiagnostic testing of the right upper extremity and additional studies of the left shows:  1. Bilateral median, ulnar, radial, and left palmar sensory responses are within normal limits. Right palmar sensory response is borderline abnormal. 2. Bilateral median and ulnar motor responses are within normal limits. 3. There is no evidence of active or chronic motor axon loss changes affecting any of the tested muscles. Motor unit configuration and recruitment pattern is within normal limits.  Impression: 1. Right median neuropathy at or distal to the wrist, consistent with the clinical diagnosis of carpal tunnel syndrome. Overall, these findings are very mild in degree electrically. 2. There is no evidence of a cervical radiculopathy, left carpal tunnel syndrome, or diffuse myopathy.   ___________________________ Nita Sickle, DO    Nerve Conduction Studies Anti Sensory Summary Table   Stim Site NR Peak (ms) Norm Peak (ms) P-T Amp (V) Norm P-T Amp  Left Median Anti Sensory (2nd Digit)  38.1C  Wrist    3.2 <3.6 18.9 >15  Right Median Anti Sensory (2nd Digit)  38.1C  Wrist    3.4 <3.6 18.4 >15  Left Radial Anti Sensory (Base 1st Digit)  38.1C  Wrist    1.9 <2.7 26.7 >14  Right Radial Anti Sensory (Base 1st Digit)  38.1C  Wrist    1.9 <2.7 30.8 >14  Left Ulnar Anti Sensory (5th Digit)  38.1C  Wrist    2.7 <3.1 19.9 >10  Right Ulnar Anti Sensory (5th Digit)  38.1C  Wrist    3.0 <3.1 21.0 >10   Motor Summary  Table   Stim Site NR Onset (ms) Norm Onset (ms) O-P Amp (mV) Norm O-P Amp Site1 Site2 Delta-0 (ms) Dist (cm) Vel (m/s) Norm Vel (m/s)  Left Median Motor (Abd Poll Brev)  38.1C  Wrist    3.3 <4.0 9.3 >6 Elbow Wrist 3.8 23.0 61 >50  Elbow    7.1  8.8         Right Median Motor (Abd Poll Brev)  38.1C  Wrist    3.8 <4.0 9.4 >6 Elbow Wrist 4.2 24.0 57 >50  Elbow    8.0  9.4         Left Ulnar Motor (Abd Dig Minimi)  38.1C  Wrist    2.2 <3.1 10.7 >7 B Elbow Wrist 3.4 21.0 62 >50  B Elbow    5.6  10.4  A Elbow B Elbow 1.6 10.0 62 >50  A Elbow    7.2  10.0         Right Ulnar Motor (Abd Dig Minimi)  38.1C  Wrist    2.5 <3.1 9.4 >7 B Elbow Wrist 3.4 20.0 59 >50  B Elbow    5.9  8.7  A Elbow B Elbow 1.6 10.0 63 >50  A Elbow    7.5  7.7          Comparison Summary Table   Stim Site NR Peak (ms) Norm Peak (ms) P-T Amp (V) Site1 Site2 Delta-P (ms) Norm Delta (ms)  Left Median/Ulnar Palm Comparison (Wrist -  8cm)  38.1C  Median Palm    2.0 <2.2 162.0 Median Palm Ulnar Palm 0.3   Ulnar Palm    1.7 <2.2 19.1      Right Median/Ulnar Palm Comparison (Wrist - 8cm)  38.1C  Median Palm    2.2 <2.2 56.6 Median Palm Ulnar Palm 0.4   Ulnar Palm    1.8 <2.2 24.7       EMG   Side Muscle Ins Act Fibs Psw Fasc Number Recrt Dur Dur. Amp Amp. Poly Poly. Comment  Right 1stDorInt Nml Nml Nml Nml Nml Nml Nml Nml Nml Nml Nml Nml N/A  Right Abd Poll Brev Nml Nml Nml Nml Nml Nml Nml Nml Nml Nml Nml Nml N/A  Right Ext Indicis Nml Nml Nml Nml Nml Nml Nml Nml Nml Nml Nml Nml N/A  Right PronatorTeres Nml Nml Nml Nml Nml Nml Nml Nml Nml Nml Nml Nml N/A  Right Biceps Nml Nml Nml Nml Nml Nml Nml Nml Nml Nml Nml Nml N/A  Right Triceps Nml Nml Nml Nml Nml Nml Nml Nml Nml Nml Nml Nml N/A  Right Deltoid Nml Nml Nml Nml Nml Nml Nml Nml Nml Nml Nml Nml N/A  Left 1stDorInt Nml Nml Nml Nml Nml Nml Nml Nml Nml Nml Nml Nml N/A  Left Ext Indicis Nml Nml Nml Nml Nml Nml Nml Nml Nml Nml Nml Nml N/A  Left PronatorTeres Nml Nml  Nml Nml Nml Nml Nml Nml Nml Nml Nml Nml N/A  Left Biceps Nml Nml Nml Nml Nml Nml Nml Nml Nml Nml Nml Nml N/A  Left Triceps Nml Nml Nml Nml Nml Nml Nml Nml Nml Nml Nml Nml N/A  Left Deltoid Nml Nml Nml Nml Nml Nml Nml Nml Nml Nml Nml Nml N/A      Waveforms:

## 2017-02-06 ENCOUNTER — Ambulatory Visit: Payer: Self-pay | Admitting: Podiatry

## 2017-02-06 ENCOUNTER — Other Ambulatory Visit: Payer: Self-pay | Admitting: Podiatry

## 2017-02-06 ENCOUNTER — Ambulatory Visit: Payer: Self-pay

## 2017-02-06 ENCOUNTER — Ambulatory Visit (INDEPENDENT_AMBULATORY_CARE_PROVIDER_SITE_OTHER): Payer: Self-pay

## 2017-02-06 VITALS — BP 132/76 | HR 71

## 2017-02-06 DIAGNOSIS — M7662 Achilles tendinitis, left leg: Secondary | ICD-10-CM

## 2017-02-06 DIAGNOSIS — G5761 Lesion of plantar nerve, right lower limb: Secondary | ICD-10-CM

## 2017-02-06 DIAGNOSIS — M79671 Pain in right foot: Secondary | ICD-10-CM

## 2017-02-06 DIAGNOSIS — M79672 Pain in left foot: Secondary | ICD-10-CM

## 2017-02-06 MED ORDER — DICLOFENAC SODIUM 75 MG PO TBEC: 75.0000 mg | DELAYED_RELEASE_TABLET | Freq: Two times a day (BID) | ORAL | 1 refills | Status: DC

## 2017-02-06 MED ORDER — METHYLPREDNISOLONE 4 MG PO TBPK: ORAL_TABLET | ORAL | 0 refills | Status: DC

## 2017-02-10 NOTE — Progress Notes (Signed)
   HPI: 56 year old female presenting today as a new patient with complaints pain to bilateral feet that has been present for the last year.  She reports the pain is located to the posterior left heel and the dorsum of the right foot.  She reports associated swelling to the lateral side of the feet.  Walking increases this pain.  She denies alleviating factors.  She has not done anything to treat the symptoms.  Patient is here for further evaluation and treatment.    Past Medical History:  Diagnosis Date  . Thyroid disease       Physical Exam: General: The patient is alert and oriented x3 in no acute distress.  Dermatology: Skin is warm, dry and supple bilateral lower extremities. Negative for open lesions or macerations.  Vascular: Palpable pedal pulses bilaterally. No edema or erythema noted. Capillary refill within normal limits.  Neurological: Epicritic and protective threshold grossly intact bilaterally.   Musculoskeletal Exam: Pain on palpation noted to the posterior tubercle of the left calcaneus at the insertion of the Achilles tendon consistent with retrocalcaneal bursitis. Range of motion within normal limits. Muscle strength 5/5 in all muscle groups bilateral lower extremities. Sharp pain with palpation of the fourth interspace and lateral compression of the metatarsal heads of the right foot consistent with neuroma.  Positive Lendell CapriceSullivan sign with loadbearing of the forefoot.  Radiographic Exam:  Posterior calcaneal spur noted to the respective calcaneus on lateral view. No fracture or dislocation noted. Normal osseous mineralization noted.     Assessment: 1. Insertional Achilles tendinitis left 2. Retrocalcaneal bursitis 3.  Morton's neuroma right fourth interspace   Plan of Care:  1. Patient was evaluated. Radiographs were reviewed today. 2. Injection of 0.5 mL Celestone Soluspan injected into the retrocalcaneal bursa. Care was taken to avoid direct injection into the  Achilles tendon. 3.  Injection of 0.5 mL Celestone Soluspan injected into the neuroma of the right foot. 4.  Cam boot dispensed. 5.  Prescription for Medrol Dosepak provided to patient. 6.  Prescription for diclofenac provided to patient. 7.  Return to clinic in 4 weeks.   Felicia Faulkner, DPM [facility]  Dr. Felecia ShellingBrent M. Macklyn Faulkner, DPM    [address]                                        [place], [place] [address]                Office [phone](336) [phone]  Fax [phone]([phone]

## 2017-03-04 ENCOUNTER — Ambulatory Visit: Payer: Self-pay | Admitting: Podiatry

## 2017-03-13 ENCOUNTER — Ambulatory Visit: Payer: BLUE CROSS/BLUE SHIELD | Admitting: Podiatry

## 2017-03-13 DIAGNOSIS — G5761 Lesion of plantar nerve, right lower limb: Secondary | ICD-10-CM

## 2017-03-13 DIAGNOSIS — M7662 Achilles tendinitis, left leg: Secondary | ICD-10-CM | POA: Diagnosis not present

## 2017-03-14 ENCOUNTER — Telehealth: Payer: Self-pay | Admitting: *Deleted

## 2017-03-14 DIAGNOSIS — M779 Enthesopathy, unspecified: Secondary | ICD-10-CM

## 2017-03-14 DIAGNOSIS — T148XXA Other injury of unspecified body region, initial encounter: Secondary | ICD-10-CM

## 2017-03-14 NOTE — Telephone Encounter (Signed)
Orders to J. Quintana, RN for pre-cert, faxed to [facility]. 

## 2017-03-14 NOTE — Telephone Encounter (Signed)
-----   Message from Felecia ShellingBrent M Evans, DPM sent at 03/13/2017  2:50 PM EST ----- Regarding: MRI left ankle Please order MRI left ankle with or without contrast  Diagnosis: Achilles tendinitis /with possible tear left  Thanks, Dr. Logan BoresEvans

## 2017-03-17 NOTE — Progress Notes (Signed)
   HPI: 57 year old female presenting today for follow-up evaluation of pain to the bilateral feet.  Wearing the cam boot helps alleviate the pain.  Walking long distances increases the pain.  Patient is here for further evaluation and treatment.    Past Medical History:  Diagnosis Date  . Thyroid disease       Physical Exam: General: The patient is alert and oriented x3 in no acute distress.  Dermatology: Skin is warm, dry and supple bilateral lower extremities. Negative for open lesions or macerations.  Vascular: Palpable pedal pulses bilaterally. No edema or erythema noted. Capillary refill within normal limits.  Neurological: Epicritic and protective threshold grossly intact bilaterally.   Musculoskeletal Exam: Pain on palpation noted to the posterior tubercle of the left calcaneus at the insertion of the Achilles tendon consistent with retrocalcaneal bursitis. Range of motion within normal limits. Muscle strength 5/5 in all muscle groups bilateral lower extremities. Sharp pain with palpation of the fourth interspace and lateral compression of the metatarsal heads of the right foot consistent with neuroma.  Positive Lendell CapriceSullivan sign with loadbearing of the forefoot.  Assessment: 1. Insertional Achilles tendinitis left 2. Retrocalcaneal bursitis 3. Morton's neuroma right fourth interspace   Plan of Care:  1. Patient was evaluated.  2. Injection of 0.5 mL Celestone Soluspan injected into the neuroma of the right foot. 3.  Orders for MRI of the left ankle placed today.  Patient has had symptoms for greater than 8 weeks and failed all conservative treatment. 4.  Continue weightbearing in cam boot. 5.   Return to clinic in 4 weeks to review MRI.   Felecia ShellingBrent M. Evans, DPM [facility]  Dr. Felecia ShellingBrent M. Evans, DPM    [address]                                        [place], [place] [address]                Office [phone]  Fax [phone]) [phone]

## 2017-03-25 ENCOUNTER — Ambulatory Visit
Admission: RE | Admit: 2017-03-25 | Discharge: 2017-03-25 | Disposition: A | Discharge status: Home / Self Care | Payer: BLUE CROSS/BLUE SHIELD | Source: Ambulatory Visit | Attending: Podiatry | Admitting: Podiatry

## 2017-04-09 ENCOUNTER — Other Ambulatory Visit: Payer: Self-pay | Admitting: Podiatry

## 2017-04-12 ENCOUNTER — Telehealth: Payer: Self-pay | Admitting: Podiatry

## 2017-04-12 NOTE — Telephone Encounter (Signed)
I'm calling for Felicia Faulkner who had an MRI a couple of weeks ago. We have not heard anything from anyone with any results or what the doctor said. Can someone please call us back at [phone]. Thank you.

## 2017-04-12 NOTE — Telephone Encounter (Signed)
I informed pt's Avni, the MRI results would be discussed at an appt, and transferred to schedulers.

## 2017-04-29 ENCOUNTER — Ambulatory Visit: Payer: BLUE CROSS/BLUE SHIELD | Admitting: Podiatry

## 2017-04-29 ENCOUNTER — Encounter: Payer: Self-pay | Admitting: Podiatry

## 2017-04-29 DIAGNOSIS — G609 Hereditary and idiopathic neuropathy, unspecified: Secondary | ICD-10-CM | POA: Diagnosis not present

## 2017-04-29 MED ORDER — GABAPENTIN 100 MG PO CAPS: 100.0000 mg | ORAL_CAPSULE | Freq: Three times a day (TID) | ORAL | 1 refills | Status: DC

## 2017-04-30 NOTE — Progress Notes (Signed)
   HPI: 57 year old female presenting today for follow up evaluation bilateral foot pain. She states the pain is unchanged at this time. She states the injection to the neuroma of the right foot provided no significant relief. Patient is here for further evaluation and treatment.   Past Medical History:  Diagnosis Date  . Thyroid disease       Physical Exam: General: The patient is alert and oriented x3 in no acute distress.  Dermatology: Skin is warm, dry and supple bilateral lower extremities. Negative for open lesions or macerations.  Vascular: Palpable pedal pulses bilaterally. No edema or erythema noted. Capillary refill within normal limits.  Neurological: Burning, tingling paresthesia noted to bilateral forefeet, worse at night. Epicritic and protective threshold grossly intact bilaterally.   Musculoskeletal Exam: Pain on palpation noted to the posterior tubercle of the left calcaneus at the insertion of the Achilles tendon consistent with retrocalcaneal bursitis. Range of motion within normal limits. Muscle strength 5/5 in all muscle groups bilateral lower extremities.  MRI Exam:  1. Degenerative spurring about the distal tibiofibular articulation with mild overlying soft tissue swelling. 2. Calcaneal enthesopathy. 3. Intact tendons and ligaments crossing the ankle joint.  Assessment: 1. Achilles tendinoplasty left 2. Idiopathic peripheral neuropathy bilateral   Plan of Care:  1. Patient was evaluated. MRI reviewed.  2. Prescription for gabapentin 100 mg #90 three times daily provided to patient.  3. Recommended wide fitting shoe gear that does not constrict the forefoot.  4. Recommended shoe gear that does not aggravate the heel. 5. Recommended permanent disability to be completed by PCP.  6. Return to clinic in 6 weeks.   Son's name is Skip.    Felecia ShellingBrent M. Jaiona Simien, DPM [facility]  Dr. Felecia ShellingBrent M. Lynford Espinoza, DPM    [address]                                         [place], [place] [address]                Office [phone](336) [phone]  Fax [phone](336) [phone]

## 2017-05-17 ENCOUNTER — Telehealth: Payer: Self-pay | Admitting: *Deleted

## 2017-05-17 MED ORDER — GABAPENTIN 100 MG PO CAPS: 100.0000 mg | ORAL_CAPSULE | Freq: Three times a day (TID) | ORAL | 0 refills | Status: AC

## 2017-05-17 NOTE — Telephone Encounter (Signed)
Dr. Philomena DohenyEvans okayed order for 4 months. Left message informing pt.

## 2017-05-17 NOTE — Telephone Encounter (Signed)
Pt called [place], needs the Gabapentin for that time.

## 2017-06-10 ENCOUNTER — Ambulatory Visit: Payer: BLUE CROSS/BLUE SHIELD | Admitting: Podiatry

## 2017-08-30 ENCOUNTER — Other Ambulatory Visit: Payer: Self-pay | Admitting: Podiatry

## 2017-09-11 ENCOUNTER — Other Ambulatory Visit: Payer: Self-pay | Admitting: Podiatry

## 2017-12-20 ENCOUNTER — Other Ambulatory Visit: Payer: Self-pay | Admitting: Podiatry

## 2018-03-01 IMAGING — MR MR ANKLE*L* W/O CM
4 of 6 series · 28 of 40 positions shown · non-contrast
Comparison: 02/06/2017 foot radiographs

CLINICAL DATA: 56-year-old female with chronic lateral ankle pain.
No known specific injury. Popping and swelling reported.

EXAM:
MRI OF THE LEFT ANKLE WITHOUT CONTRAST
TECHNIQUE: Multiplanar, multisequence MR imaging of the ankle was performed. No
intravenous contrast was administered.

[Series 4: T2 fat-sat · axial · 4.0mm · 0.56mm/px · z∈[-99,+31]mm · 6 of 27 slices shown (1 of 3)]
[im 1/27]
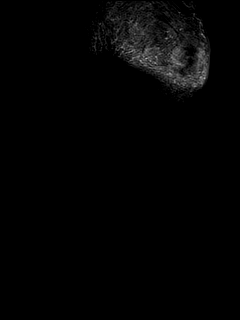
[im 6/27]
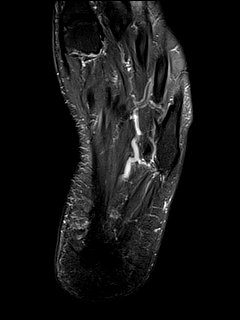
[im 11/27]
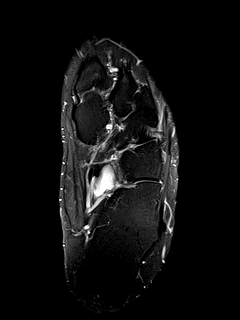
[im 16/27]
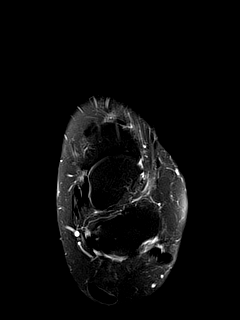
[im 21/27]
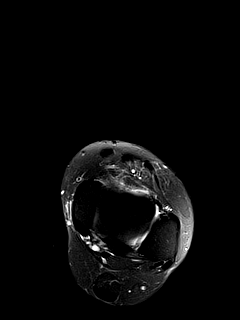
[im 27/27]
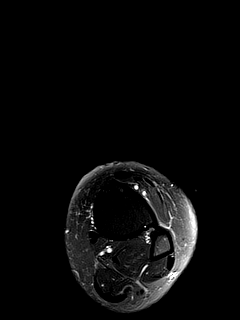

[Series 5: PD fat-sat · axial · 4.0mm · 0.56mm/px · z∈[-99,+31]mm · 7 of 27 slices shown]
[im 1/27]
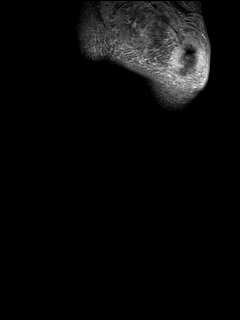
[im 5/27]
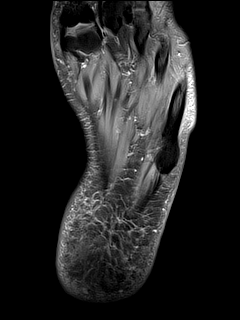
[im 9/27]
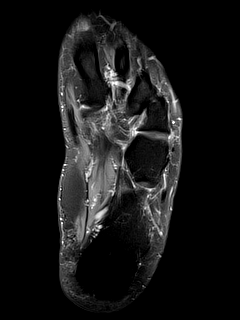
[im 14/27]
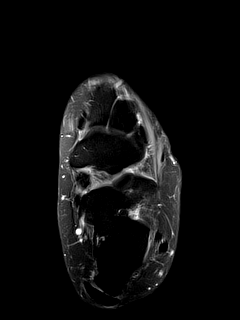
[im 18/27]
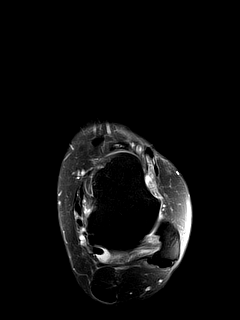
[im 22/27]
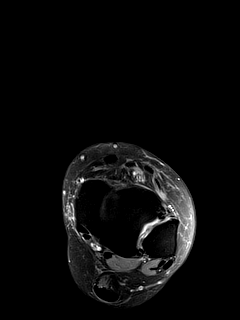
[im 27/27]
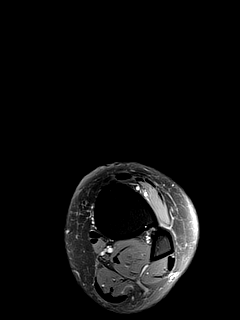

[Series 7: T2 fat-sat · sagittal · 3.0mm · 0.35mm/px · 6 of 23 slices shown (2 of 3)]
[im 1/23]
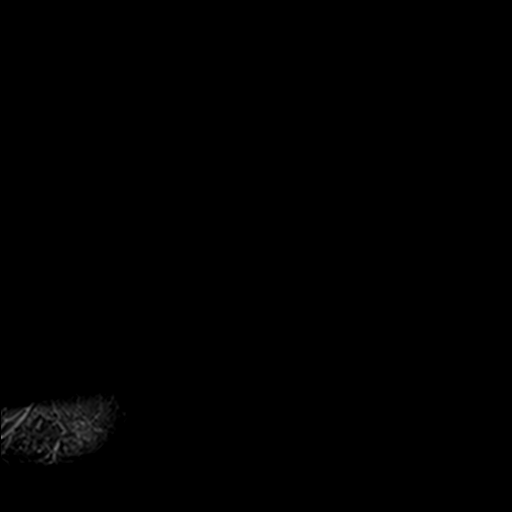
[im 5/23]
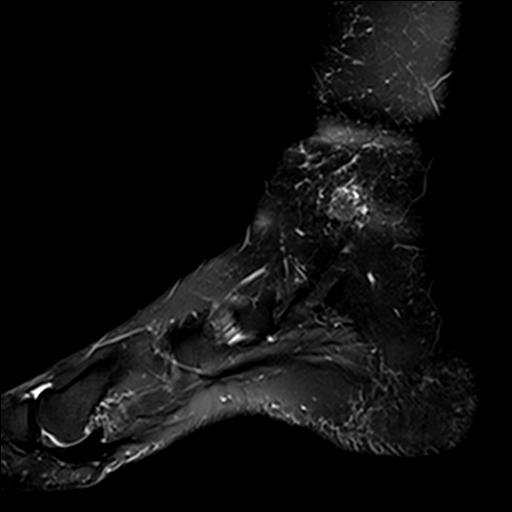
[im 9/23]
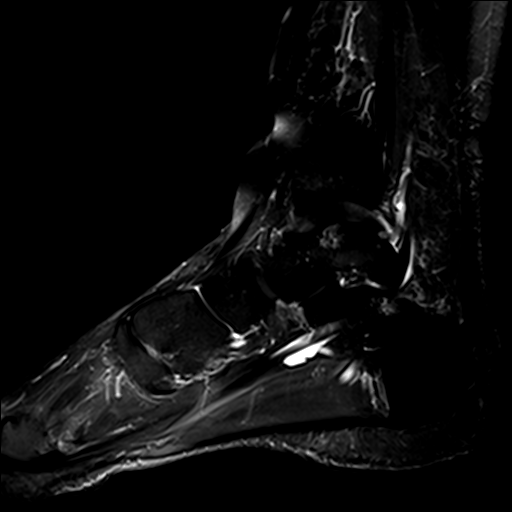
[im 14/23]
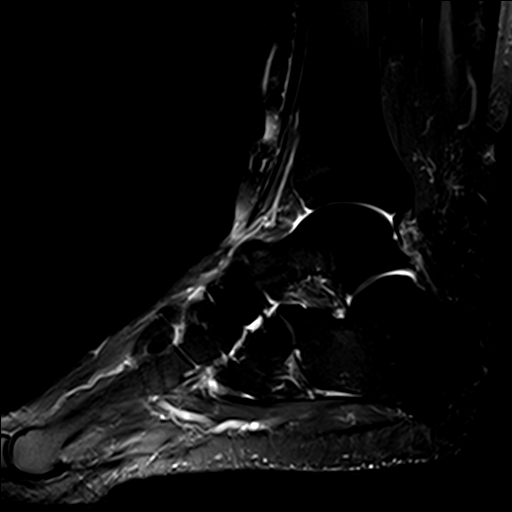
[im 18/23]
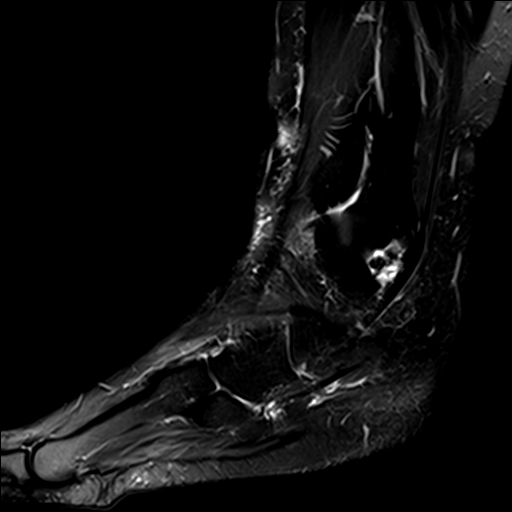
[im 23/23]
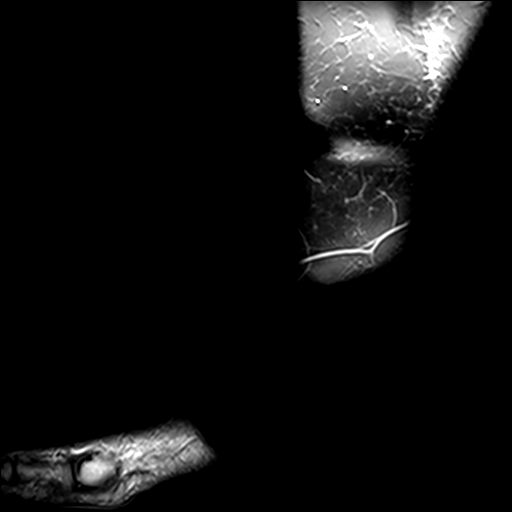

[Series 8: T2 fat-sat · coronal · 4.0mm · 0.33mm/px · 9 of 35 slices shown (3 of 3)]
[im 1/35]
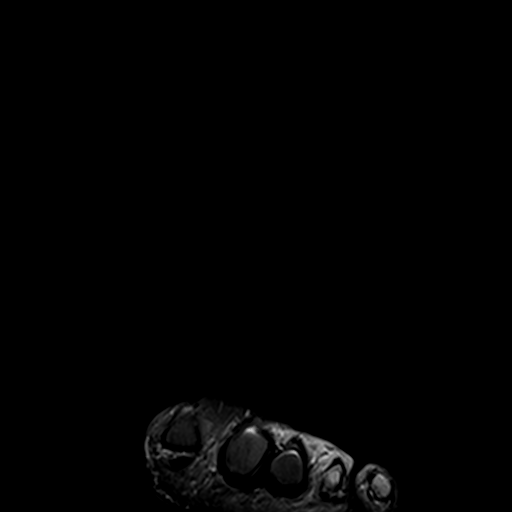
[im 5/35]
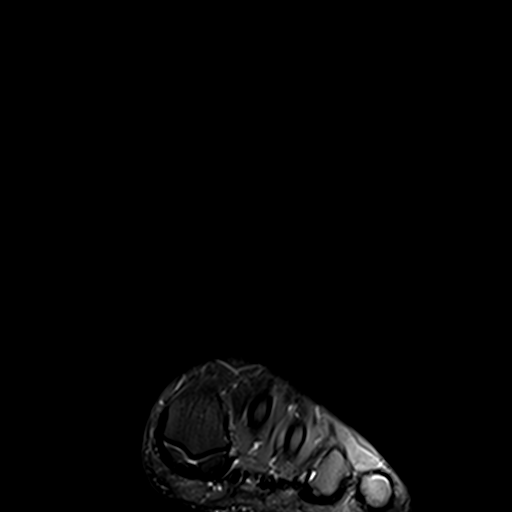
[im 9/35]
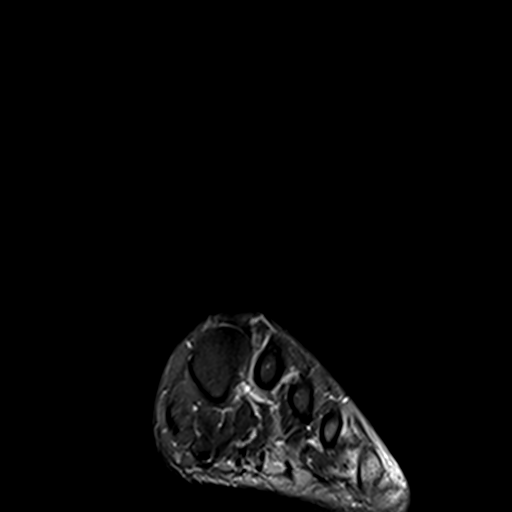
[im 13/35]
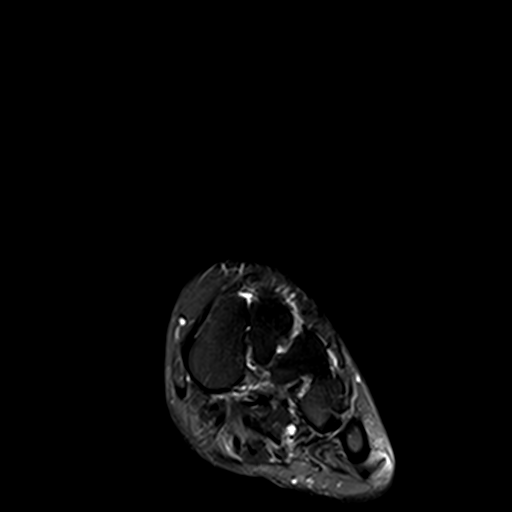
[im 18/35]
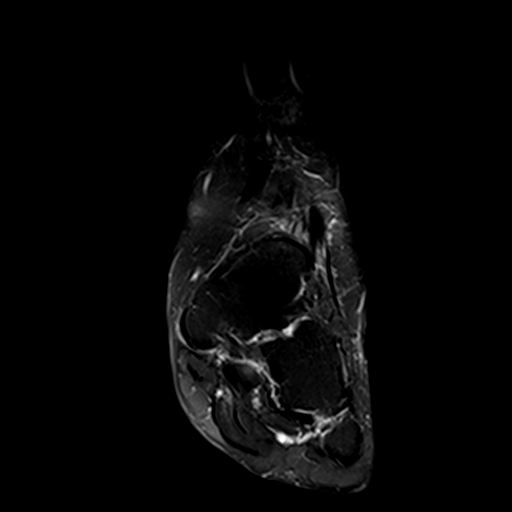
[im 22/35]
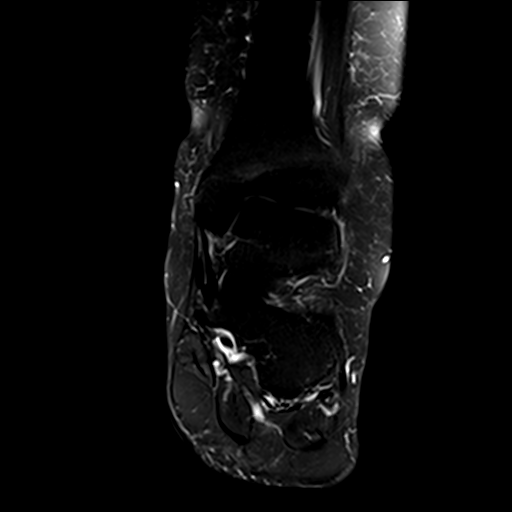
[im 26/35]
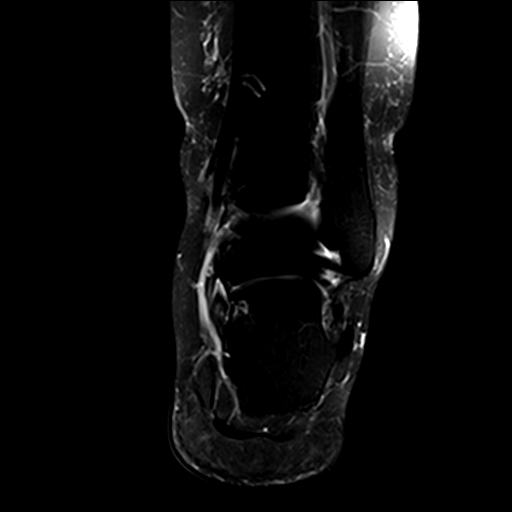
[im 30/35]
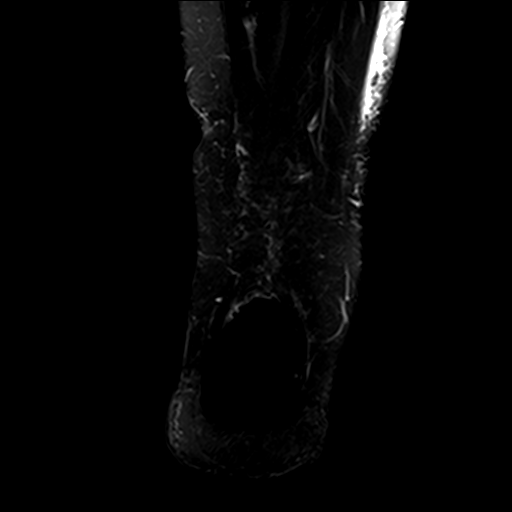
[im 35/35]
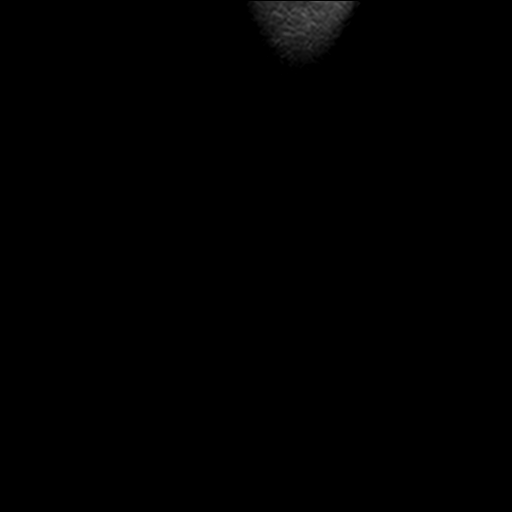

[28 of 40 positions shown; findings below may reference images not displayed]

FINDINGS: TENDONS

Peroneal: Intact peroneus longus and peroneus brevis tendons.

Posteromedial: Intact tibialis posterior, flexor hallucis longus and
flexor digitorum longus tendons.

Anterior: Intact tibialis anterior, extensor hallucis longus and
extensor digitorum longus tendons.

Achilles: Intact.

Plantar Fascia: Intact.

LIGAMENTS

Lateral: Intact.

Medial: Intact.

CARTILAGE

Ankle Joint: No joint effusion or chondral defect.

Subtalar Joints/Sinus Tarsi: No joint effusion or chondral defect.

Bones: Minimal osteoarthritic spurring along the anterior aspect of
the distal tibiofibular articulation. Prominent posterior and tiny
plantar calcaneal enthesophytes.

Other: Mild anterolateral soft tissue edema.
IMPRESSION: 1. Degenerative spurring about the distal tibiofibular articulation
with mild overlying soft tissue swelling.
2. Calcaneal enthesopathy.
3. Intact tendons and ligaments crossing the ankle joint.

## 2019-07-26 ENCOUNTER — Other Ambulatory Visit: Payer: Self-pay

## 2019-07-26 DIAGNOSIS — Z5321 Procedure and treatment not carried out due to patient leaving prior to being seen by health care provider: Secondary | ICD-10-CM | POA: Diagnosis not present

## 2019-07-26 DIAGNOSIS — M542 Cervicalgia: Secondary | ICD-10-CM | POA: Diagnosis not present

## 2019-07-26 DIAGNOSIS — I10 Essential (primary) hypertension: Secondary | ICD-10-CM | POA: Diagnosis not present

## 2019-07-26 DIAGNOSIS — R519 Headache, unspecified: Secondary | ICD-10-CM | POA: Diagnosis not present

## 2019-07-27 ENCOUNTER — Encounter (HOSPITAL_COMMUNITY): Payer: Self-pay | Admitting: *Deleted

## 2019-07-27 ENCOUNTER — Emergency Department (HOSPITAL_COMMUNITY)
Admission: EM | Admit: 2019-07-27 | Discharge: 2019-07-27 | Disposition: A | Discharge status: Home / Self Care | Payer: BLUE CROSS/BLUE SHIELD | Attending: Emergency Medicine | Admitting: Emergency Medicine

## 2019-07-27 ENCOUNTER — Other Ambulatory Visit: Payer: Self-pay

## 2019-07-27 HISTORY — DX: Essential (primary) hypertension: I10

## 2019-07-27 NOTE — ED Triage Notes (Signed)
Pt reports her BP was 200/100 today around noon. She took her BP meds around 5 pm today. She has headache, neck pain and feels " a noise " in her left ear. Denies visual changes, nausea or dizziness.

## 2019-12-17 ENCOUNTER — Telehealth (HOSPITAL_COMMUNITY): Payer: Self-pay | Admitting: Family

## 2019-12-17 DIAGNOSIS — U071 COVID-19: Secondary | ICD-10-CM

## 2019-12-17 NOTE — Telephone Encounter (Signed)
Called to discuss with Felicia Faulkner about Covid symptoms and the use of casirivimab/imdevimab, a combination monoclonal antibody infusion for those with mild to moderate Covid symptoms and at a high risk of hospitalization.     Pt is qualified for this infusion at the infusion center due to co-morbid conditions and/or a member of an at-risk group, however declines infusion at this time. Symptoms tier reviewed as well as criteria for ending isolation.  Symptoms reviewed that would warrant ED/Hospital evaluation. Preventative practices reviewed. Patient verbalized understanding. Patient advised to call back if he decides that he does want to get infusion. Callback number to the infusion center given.   Symptom onset 12/15/19.  Patient Active Problem List   Diagnosis Date Noted  . Atypical chest pain 07/15/2014  . Family history of heart disease 07/15/2014  . Screening for lipid disorders 07/15/2014    Erin Uecker,NP

## 2021-01-09 ENCOUNTER — Ambulatory Visit: Payer: BLUE CROSS/BLUE SHIELD | Admitting: Podiatry

## 2021-01-09 ENCOUNTER — Other Ambulatory Visit: Payer: Self-pay

## 2021-01-09 DIAGNOSIS — G609 Hereditary and idiopathic neuropathy, unspecified: Secondary | ICD-10-CM | POA: Diagnosis not present

## 2021-01-09 DIAGNOSIS — I890 Lymphedema, not elsewhere classified: Secondary | ICD-10-CM | POA: Diagnosis not present

## 2021-01-09 NOTE — Progress Notes (Signed)
   HPI: 60 y.o. female presenting today presenting with her husband for evaluation of multiple complaints regarding the bilateral lower extremities.  Patient states that she has had neuropathy and no feeling to the front of her feet x12 years.  Patient also states that over the past year she has had increased swelling to the bilateral feet.  She has also been diagnosed previously with heel spurs bilaterally and MRI was ordered of the left lower extremity which were positive for findings of a calcaneal enthesopathy.  They present for further treatment and there very frustrated with the swelling of the bilateral feet with the chronic pain  Past Medical History:  Diagnosis Date   Hypertension    Thyroid disease      Physical Exam: General: The patient is alert and oriented x3 in no acute distress.  Dermatology: Skin is warm, dry and supple bilateral lower extremities. Negative for open lesions or macerations.  Vascular: Palpable pedal pulses bilaterally.  Chronic lower extremity edema noted bilateral  Neurological: Epicritic and protective threshold absent bilateral forefoot  Musculoskeletal Exam: Pain on palpation diffusely throughout the entire foot and ankle  Assessment: 1.  Lower extremity lymphedema bilateral 2.  Calcaneal heel spurs bilateral. LT > RT 3.  Idiopathic peripheral neuropathy x12 years   Plan of Care:  1. Patient evaluated. 2.  Feel like the main priority to address is the lymphedema bilateral.  I explained that if the patient is able to improve the swelling in her feet some of the peripheral neuropathy may improve 3.  I also explained the patient that the calcaneal heel spurs are likely not causing the swelling throughout the entire leg.  Patient and husband understand 4.  Continue massage therapy as per prescribing PCP 5.  Advised the patient that they may want to pursue a vascular consult from their PCP 6.  Return to clinic as needed      Felecia Shelling,  DPM [facility]  Dr. Felecia Shelling, DPM    [address] [place], [place] [address]                Office [phone]  Fax [phone]

## 2022-07-04 ENCOUNTER — Encounter (HOSPITAL_BASED_OUTPATIENT_CLINIC_OR_DEPARTMENT_OTHER): Payer: Self-pay | Admitting: Emergency Medicine

## 2022-07-04 ENCOUNTER — Emergency Department (HOSPITAL_BASED_OUTPATIENT_CLINIC_OR_DEPARTMENT_OTHER)
Admission: EM | Admit: 2022-07-04 | Discharge: 2022-07-04 | Disposition: A | Discharge status: Home / Self Care | Payer: BLUE CROSS/BLUE SHIELD | Attending: Emergency Medicine | Admitting: Emergency Medicine

## 2022-07-04 ENCOUNTER — Emergency Department (HOSPITAL_BASED_OUTPATIENT_CLINIC_OR_DEPARTMENT_OTHER): Payer: BLUE CROSS/BLUE SHIELD

## 2022-07-04 ENCOUNTER — Other Ambulatory Visit: Payer: Self-pay

## 2022-07-04 DIAGNOSIS — C50611 Malignant neoplasm of axillary tail of right female breast: Secondary | ICD-10-CM | POA: Diagnosis not present

## 2022-07-04 DIAGNOSIS — R531 Weakness: Secondary | ICD-10-CM | POA: Diagnosis not present

## 2022-07-04 DIAGNOSIS — R110 Nausea: Secondary | ICD-10-CM | POA: Diagnosis not present

## 2022-07-04 DIAGNOSIS — X58XXXA Exposure to other specified factors, initial encounter: Secondary | ICD-10-CM | POA: Insufficient documentation

## 2022-07-04 DIAGNOSIS — R519 Headache, unspecified: Secondary | ICD-10-CM | POA: Diagnosis not present

## 2022-07-04 DIAGNOSIS — S4981XA Other specified injuries of right shoulder and upper arm, initial encounter: Secondary | ICD-10-CM | POA: Insufficient documentation

## 2022-07-04 DIAGNOSIS — Z79899 Other long term (current) drug therapy: Secondary | ICD-10-CM | POA: Diagnosis not present

## 2022-07-04 DIAGNOSIS — C50911 Malignant neoplasm of unspecified site of right female breast: Secondary | ICD-10-CM

## 2022-07-04 LAB — COMPREHENSIVE METABOLIC PANEL
ALT: 18 U/L (ref 0–44)
AST: 18 U/L (ref 15–41)
Albumin: 3.7 g/dL (ref 3.5–5.0)
Alkaline Phosphatase: 67 U/L (ref 38–126)
Anion gap: 9 (ref 5–15)
BUN: 13 mg/dL (ref 8–23)
CO2: 30 mmol/L (ref 22–32)
Calcium: 8.8 mg/dL — ABNORMAL LOW (ref 8.9–10.3)
Chloride: 102 mmol/L (ref 98–111)
Creatinine, Ser: 0.7 mg/dL (ref 0.44–1.00)
GFR, Estimated: >60 mL/min (ref >60)
Glucose, Bld: 191 mg/dL — ABNORMAL HIGH (ref 70–99)
Potassium: 3.5 mmol/L (ref 3.5–5.1)
Sodium: 141 mmol/L (ref 135–145)
Total Bilirubin: 0.4 mg/dL (ref 0.3–1.2)
Total Protein: 6 g/dL — ABNORMAL LOW (ref 6.5–8.1)

## 2022-07-04 LAB — CBC WITH DIFFERENTIAL/PLATELET
Abs Immature Granulocytes: 0.06 10*3/uL (ref 0.00–0.07)
Basophils Absolute: 0 10*3/uL (ref 0.0–0.1)
Basophils Relative: 1 %
Eosinophils Absolute: 0 10*3/uL (ref 0.0–0.5)
Eosinophils Relative: 0 %
HCT: 35.8 % — ABNORMAL LOW (ref 36.0–46.0)
Hemoglobin: 11.6 g/dL — ABNORMAL LOW (ref 12.0–15.0)
Immature Granulocytes: 1 %
Lymphocytes Relative: 43 %
Lymphs Abs: 1.9 10*3/uL (ref 0.7–4.0)
MCH: 30.2 pg (ref 26.0–34.0)
MCHC: 32.4 g/dL (ref 30.0–36.0)
MCV: 93.2 fL (ref 80.0–100.0)
Monocytes Absolute: 0.5 10*3/uL (ref 0.1–1.0)
Monocytes Relative: 11 %
Neutro Abs: 1.9 10*3/uL (ref 1.7–7.7)
Neutrophils Relative %: 44 %
Platelets: 47 10*3/uL — ABNORMAL LOW (ref 150–400)
RBC: 3.84 MIL/uL — ABNORMAL LOW (ref 3.87–5.11)
RDW: 15.2 % (ref 11.5–15.5)
WBC: 4.4 10*3/uL (ref 4.0–10.5)
nRBC: 0 % (ref 0.0–0.2)

## 2022-07-04 LAB — URINALYSIS, ROUTINE W REFLEX MICROSCOPIC
Bilirubin Urine: NEGATIVE
Glucose, UA: NEGATIVE mg/dL
Hgb urine dipstick: NEGATIVE
Ketones, ur: NEGATIVE mg/dL
Leukocytes,Ua: NEGATIVE
Nitrite: NEGATIVE
Protein, ur: NEGATIVE mg/dL
Specific Gravity, Urine: 1.008 (ref 1.005–1.030)
pH: 5 (ref 5.0–8.0)

## 2022-07-04 LAB — TROPONIN I (HIGH SENSITIVITY): Troponin I (High Sensitivity): 3 ng/L (ref <18)

## 2022-07-04 LAB — LACTIC ACID, PLASMA: Lactic Acid, Venous: 1.6 mmol/L (ref 0.5–1.9)

## 2022-07-04 MED ORDER — LACTATED RINGERS IV SOLN: INTRAVENOUS | Status: DC

## 2022-07-04 NOTE — ED Provider Notes (Signed)
[place] EMERGENCY DEPARTMENT AT [facility] Provider Note   CSN: [id number] Arrival date & time: 07/04/22  4098     History  Chief Complaint  Patient presents with   Arm Injury    Felicia Faulkner is a 62 y.o. female.  HPI Patient gets chemotherapy infusion for breast cancer.  She got treatment of IV Taxotere and Cytoxan 4\23.  The following day the IV site was red and had a sore.  There is also slight amount of drainage and itching.  Patient was treated with 5 days of Keflex.  She managed to take 4 days of Keflex but then started to get dizziness and nausea and did not take the last day.  The wound has healed significantly however the patient is experiencing headache and increasing dizziness and weakness.  Headache was initially more left-sided but has a generalized quality as well.  She has not developed vomiting.  Blood pressures have been elevated over the past couple days.  No fever or neck stiffness.    Home Medications Prior to Admission medications   Medication Sig Start Date End Date Taking? Authorizing Provider  cephALEXin (KEFLEX) 500 MG capsule Take 500 mg by mouth 4 (four) times daily. 06/27/22  Yes [provider]  hydrochlorothiazide (MICROZIDE) 12.5 MG capsule Take 12.5 mg by mouth every morning. 12/19/21  Yes [provider]  pantoprazole (PROTONIX) 40 MG tablet Take 40 mg by mouth daily. 04/11/22 07/10/22 Yes [provider]  rosuvastatin (CRESTOR) 10 MG tablet Take 10 mg by mouth daily. 12/15/21  Yes [provider]  atorvastatin (LIPITOR) 20 MG tablet  02/04/17   [provider]  diclofenac (VOLTAREN) 75 MG EC tablet TAKE 1 TABLET BY MOUTH TWICE DAILY 08/30/17   Felecia Shelling, DPM  etodolac (LODINE) 400 MG tablet Take 1 tablet by mouth daily. Take 1 tab twice daily as needed for pain 08/10/14   [provider]  fosinopril-hydrochlorothiazide (MONOPRIL-HCT) 10-12.5 MG per tablet Take 1 tablet by mouth daily.  06/13/14   [provider]  gabapentin (NEURONTIN) 100 MG capsule Take 1 capsule (100 mg total) by mouth 3 (three) times daily. 05/17/17   Felecia Shelling, DPM  gabapentin (NEURONTIN) 100 MG capsule TAKE 1 CAPSULE BY MOUTH THREE TIMES DAILY 12/20/17   Felecia Shelling, DPM  prochlorperazine (COMPAZINE) 5 MG tablet Take by mouth.    [provider]      Allergies    Patient has no known allergies.    Review of Systems   Review of Systems  Physical Exam Updated Vital Signs BP (!) 128/59 (BP Location: Right Arm)   Pulse 72   Temp 98.5 F (36.9 C) (Oral)   Resp 16   Wt 90.7 kg   SpO2 97%   BMI 31.32 kg/m  Physical Exam Constitutional:      Comments: Alert nontoxic clear mental status.  GCS 15.  HENT:     Head: Normocephalic and atraumatic.     Mouth/Throat:     Pharynx: Oropharynx is clear.  Eyes:     Extraocular Movements: Extraocular movements intact.  Cardiovascular:     Rate and Rhythm: Normal rate and regular rhythm.  Pulmonary:     Effort: Pulmonary effort is normal.     Breath sounds: Normal breath sounds.  Abdominal:     General: There is no distension.     Palpations: Abdomen is soft.     Tenderness: There is no abdominal tenderness. There is no guarding.  Musculoskeletal:        General: No swelling. Normal range of motion.     Cervical back: Neck supple.     Right lower leg: No edema.     Left lower leg: No edema.  Skin:    General: Skin is warm and dry.     Comments: Patient has an IV site on the left forearm that has a small erosion several millimeters with a yellow eschar.  Surrounding skin is slightly indurated but healing.  This does not appear cellulitic and there is no fluctuance.  Appears to be healing and the remainder of the forearm is soft and pliable.  Neurological:     General: No focal deficit present.     Mental Status: She is oriented to person, place, and time.     ED Results / Procedures / Treatments   Labs (all labs  ordered are listed, but only abnormal results are displayed) Labs Reviewed  COMPREHENSIVE METABOLIC PANEL  LACTIC ACID, PLASMA  LACTIC ACID, PLASMA  CBC WITH DIFFERENTIAL/PLATELET  URINALYSIS, ROUTINE W REFLEX MICROSCOPIC  TROPONIN I (HIGH SENSITIVITY)    EKG None  Radiology No results found.  Procedures Procedures    Medications Ordered in ED Medications  lactated ringers infusion (has no administration in time range)    ED Course/ Medical Decision Making/ A&P                             Medical Decision Making Amount and/or Complexity of Data Reviewed Labs: ordered. Radiology: ordered.  Risk Prescription drug management.   Patient has complex medical history including breast cancer undergoing current treatment with chemotherapy.  She reports developing dizziness after taking Keflex for a local cellulitis at the previous IV site.  Dizziness has persisted and some headache.  Patient's exam is nonfocal with normal neurologic exam.  Differential diagnosis includes metastatic disease\metabolic derangement\dehydration or orthostasis\infectious complication.  Will proceed with diagnostic lab work and CT head.  Clinically patient is well in appearance.  She is stable with normal vital signs.  The prior site of cellulitis appears to be healing well.  Not think at this time we need empiric antibiotics or sepsis treatment.  Chemistry panel normal except CBG 191 calcium 8.8 normal LFTs and normal GFR.  No anion gap.  Urinalysis negative for any sign of infection.  White count 4.4 significant anemia.  Hemoglobin is 11.6 and normal differential on the CBC.  CT head does not show acute findings.  This time with patient clinically well in appearance and no findings on CT, I do feel she is stable to continue outpatient workup.  Currently no evidence of midline shift or creased intracranial pressure.  Patient does not have any focal neurologic deficits.  She may need MRI for this  persistent dizziness to completely rule out metastatic disease.  This time no significant electrolyte derangement and patient does not appear dehydrated.  I have low suspicion that this is the residual effect of Keflex.  She has discontinued the Keflex and the area of cellulitis healing well.  At this time stable for discharge with close follow-up and low threshold for return if any new or changing symptoms.  Reviewed all results and planning with the patient's son at bedside.        Final Clinical Impression(s) / ED Diagnoses Final diagnoses:  General weakness  Bad headache  Malignant neoplasm of right female breast, unspecified estrogen receptor status, unspecified site of  breast [facility])    Rx / DC Orders ED Discharge Orders     None         Arby Barrette, MD 07/16/22 2017

## 2022-07-04 NOTE — ED Triage Notes (Signed)
Pt arrives pov, slow gait with assistance with c/o LT arm pain, concern of infection from needle from chemo. Bruising noted.

## 2022-07-04 NOTE — Discharge Instructions (Signed)
1.  At this time your lab work is stable.  Your blood pressure is stable. 2.  Follow-up with your oncologist for recheck as soon as possible. 3.  Return to the emergency department if you develop a fever, unanticipated pain or other concerning symptoms.

## 2022-07-04 NOTE — ED Notes (Signed)
Provider aware that we are unable to get all the blood work... Multiple different people have tried... IV has been placed but all the blood work has been unsuccessful.Marland KitchenMarland Kitchen

## 2022-07-04 NOTE — ED Notes (Signed)
Discharge paperwork given and verbally understood.
